# Patient Record
Sex: Male | Born: 1980 | Race: Black or African American | Hispanic: No | Marital: Single | Smoking: Current every day smoker
Health system: Southern US, Community
[De-identification: ages and names within clinical notes are randomized; demographics above are authoritative.]

---

## 2005-03-13 ENCOUNTER — Ambulatory Visit: Payer: Self-pay | Admitting: Family Medicine

## 2005-05-28 ENCOUNTER — Ambulatory Visit: Payer: Self-pay | Admitting: Family Medicine

## 2005-09-10 ENCOUNTER — Ambulatory Visit: Payer: Self-pay | Admitting: Family Medicine

## 2005-09-14 ENCOUNTER — Ambulatory Visit: Payer: Self-pay | Admitting: *Deleted

## 2007-07-16 ENCOUNTER — Ambulatory Visit: Payer: Self-pay | Admitting: Vascular Surgery

## 2007-07-16 ENCOUNTER — Inpatient Hospital Stay (HOSPITAL_COMMUNITY): Admission: EM | Admit: 2007-07-16 | Discharge: 2007-07-18 | Payer: Self-pay | Admitting: Emergency Medicine

## 2007-08-12 ENCOUNTER — Ambulatory Visit: Payer: Self-pay | Admitting: Vascular Surgery

## 2009-10-16 ENCOUNTER — Emergency Department (HOSPITAL_COMMUNITY): Admission: EM | Admit: 2009-10-16 | Discharge: 2009-10-16 | Payer: Self-pay | Admitting: Emergency Medicine
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
## 2010-11-26 ENCOUNTER — Emergency Department (HOSPITAL_COMMUNITY): Payer: No Typology Code available for payment source

## 2010-11-26 ENCOUNTER — Emergency Department (HOSPITAL_COMMUNITY)
Admission: EM | Admit: 2010-11-26 | Discharge: 2010-11-26 | Disposition: A | Discharge status: Home / Self Care | Payer: No Typology Code available for payment source | Attending: Emergency Medicine | Admitting: Emergency Medicine

## 2010-11-26 DIAGNOSIS — IMO0002 Reserved for concepts with insufficient information to code with codable children: Secondary | ICD-10-CM | POA: Insufficient documentation

## 2010-11-26 DIAGNOSIS — K029 Dental caries, unspecified: Secondary | ICD-10-CM | POA: Insufficient documentation

## 2010-11-26 DIAGNOSIS — Y92410 Unspecified street and highway as the place of occurrence of the external cause: Secondary | ICD-10-CM | POA: Insufficient documentation

## 2011-02-02 LAB — I-STAT 8, (EC8 V) (CONVERTED LAB)
Chloride: 110
Glucose, Bld: 141 — ABNORMAL HIGH
Potassium: 2.9 — ABNORMAL LOW
pH, Ven: 7.285

## 2011-02-02 LAB — CBC
HCT: 31.7 — ABNORMAL LOW
HCT: 33.7 — ABNORMAL LOW
HCT: 40.2
MCV: 88.7
MCV: 89.2
Platelets: 179
Platelets: 180
Platelets: 244
RDW: 13.3
RDW: 13.4
WBC: 13 — ABNORMAL HIGH

## 2011-02-02 LAB — BASIC METABOLIC PANEL
BUN: 3 — ABNORMAL LOW
Chloride: 106
Glucose, Bld: 130 — ABNORMAL HIGH
Potassium: 4.1

## 2011-02-02 LAB — POCT I-STAT 4, (NA,K, GLUC, HGB,HCT)
Operator id: 173791
Sodium: 143

## 2011-02-02 LAB — ETHANOL: Alcohol, Ethyl (B): 166 — ABNORMAL HIGH

## 2011-02-02 LAB — POCT I-STAT CREATININE
Creatinine, Ser: 1.3
Operator id: 133351

## 2011-03-02 ENCOUNTER — Emergency Department (HOSPITAL_COMMUNITY)
Admission: EM | Admit: 2011-03-02 | Discharge: 2011-03-02 | Discharge status: Against Medical Advice | Payer: Self-pay | Attending: Emergency Medicine | Admitting: Emergency Medicine

## 2011-03-02 DIAGNOSIS — Z0389 Encounter for observation for other suspected diseases and conditions ruled out: Secondary | ICD-10-CM | POA: Insufficient documentation

## 2013-08-10 ENCOUNTER — Emergency Department (HOSPITAL_COMMUNITY)
Admission: EM | Admit: 2013-08-10 | Discharge: 2013-08-10 | Disposition: A | Discharge status: Home / Self Care | Payer: Self-pay | Attending: Emergency Medicine | Admitting: Emergency Medicine

## 2013-08-10 ENCOUNTER — Emergency Department (HOSPITAL_COMMUNITY): Payer: Self-pay

## 2013-08-10 ENCOUNTER — Encounter (HOSPITAL_COMMUNITY): Payer: Self-pay | Admitting: Emergency Medicine

## 2013-08-10 DIAGNOSIS — M25561 Pain in right knee: Secondary | ICD-10-CM

## 2013-08-10 DIAGNOSIS — Z87891 Personal history of nicotine dependence: Secondary | ICD-10-CM | POA: Insufficient documentation

## 2013-08-10 DIAGNOSIS — S99929A Unspecified injury of unspecified foot, initial encounter: Principal | ICD-10-CM

## 2013-08-10 DIAGNOSIS — Y9389 Activity, other specified: Secondary | ICD-10-CM | POA: Insufficient documentation

## 2013-08-10 DIAGNOSIS — F17200 Nicotine dependence, unspecified, uncomplicated: Secondary | ICD-10-CM | POA: Insufficient documentation

## 2013-08-10 DIAGNOSIS — S8990XA Unspecified injury of unspecified lower leg, initial encounter: Secondary | ICD-10-CM | POA: Insufficient documentation

## 2013-08-10 DIAGNOSIS — S99919A Unspecified injury of unspecified ankle, initial encounter: Principal | ICD-10-CM

## 2013-08-10 DIAGNOSIS — M79661 Pain in right lower leg: Secondary | ICD-10-CM

## 2013-08-10 DIAGNOSIS — W108XXA Fall (on) (from) other stairs and steps, initial encounter: Secondary | ICD-10-CM | POA: Insufficient documentation

## 2013-08-10 DIAGNOSIS — Y929 Unspecified place or not applicable: Secondary | ICD-10-CM | POA: Insufficient documentation

## 2013-08-10 MED ORDER — OXYCODONE-ACETAMINOPHEN 5-325 MG PO TABS
2.0000 | ORAL_TABLET | Freq: Once | ORAL | Status: AC
  Administered 2013-08-10: 2 via ORAL
  Filled 2013-08-10: qty 2

## 2013-08-10 MED ORDER — HYDROCODONE-ACETAMINOPHEN 5-325 MG PO TABS: 1.0000 | ORAL_TABLET | ORAL | Status: AC | PRN

## 2013-08-10 NOTE — Discharge Instructions (Signed)
Read the information below.  Use the prescribed medication as directed.  Please discuss all new medications with your pharmacist.  Do not take additional tylenol while taking the prescribed pain medication to avoid overdose.  You may return to the Emergency Department at any time for worsening condition or any new symptoms that concern you.  If you develop fevers, uncontrolled pain, weakness or numbness of the extremity, severe discoloration of the skin, or you are unable to walk, return to the ER for a recheck.       Knee Bracing Knee braces are supports to help stabilize and protect an injured or painful knee. They come in many different styles. They should support and protect the knee without increasing the chance of other injuries to yourself or others. It is important not to have a false sense of security when using a brace. Knee braces that help you to keep using your knee:  Do not restore normal knee stability under high stress forces.  May decrease some aspects of athletic performance. Some of the different types of knee braces are:  Prophylactic knee braces are designed to prevent or reduce the severity of knee injuries during sports that make injury to the knee more likely.  Rehabilitative knee braces are designed to allow protected motion of:  Injured knees.  Knees that have been treated with or without surgery. There is no evidence that the use of a supportive knee brace protects the graft following a successful anterior cruciate ligament (ACL) reconstruction. However, braces are sometimes used to:   Protect injured ligaments.  Control knee movement during the initial healing period. They may be used as part of the treatment program for the various injured ligaments or cartilage of the knee including the:  Anterior cruciate ligament.  Medial collateral ligament.  Medial or lateral cartilage (meniscus).  Posterior cruciate ligament.  Lateral collateral  ligament. Rehabilitative knee braces are most commonly used:  During crutch-assisted walking right after injury.  During crutch-assisted walking right after surgery to repair the cartilage and/or cruciate ligament injury.  For a short period of time, 2-8 weeks, after the injury or surgery. The value of a rehabilitative brace as opposed to a cast or splint includes the:  Ability to adjust the brace for swelling.  Ability to remove the brace for examinations, icing or showering.  Ability to allow for movement in a controlled range of motion. Functional knee braces give support to knees that have already been injured. They are designed to provide stability for the injured knee and provide protection after repair. Functional knee braces may not affect performance much. Lower extremity muscle strengthening, flexibility, and improvement in technique are more important than bracing in treating ligamentous knee injuries. Functional braces are not a substitute for rehabilitation or surgical procedures. Unloader/offloader braces are designed to provide pain relief in arthritic knees. Patients with wear and tear arthritis from growing old or from an old cartilage injury (osteoarthritis) of the knee, and bow legged (varus) or knock knee (valgus) deformities, often develop increased pain in the arthritic side due to increased loading. Unloader/offloader braces are made to reduce uneven loading in such knees. There is reduction in bowing out movement in bow legged knees when the correct unloader brace is used. Patients with advanced osteoarthritis or severe varus or valgus alignment problems would not likely benefit from bracing. Patellofemoral braces help the kneecap to move smoothly and well centered over the end of the femur in the knee.  Most people who wear knee  braces feel that they help. However, there is a lack of scientific evidence that knee braces are helpful at the level needed for athletic  participation to prevent injury. In spite of this, athletes report an increase in knee stability, pain relief, performance improvement, and confidence during athletics when using a brace.  Different knee problems require different knee braces:  Your caregiver may suggest one kind of knee brace after knee surgery.  A caregiver may choose another kind of knee brace for support instead of surgery for some types of torn ligaments.  You may also need one for pain in the front of your knee that is not getting better with strengthening and flexibility exercises. Get your caregiver's advice if you want to try a knee brace. The caregiver will advise you on where to get them and provide a prescription when it is needed to fashion and/or fit the brace. Knee braces are the least important part of preventing knee injuries or getting better following injury. Stretching, strengthening and technique improvement are far more important in caring for and preventing knee injuries. When strengthening your knee, increase your activities a little at a time so as not to develop injuries from over use. Work out an exercise plan with your caregiver and/or physical therapist to get the best program for you. Do not let a knee brace become a crutch. Always remember, there are no braces which support the knee as well as your original ligaments and cartilage you were born with. Conditioning, proper warm-up and stretching remain the most important parts of keeping your knees healthy. HOW TO USE A KNEE BRACE  During sports, knee braces should be used as directed by your caregiver.  Make sure that the hinges are where the knee bends.  Straps, tapes, or hook-and-loop tapes should be fastened around your leg as instructed.  You should check the placement of the brace during activities to make sure that it has not moved. Poorly positioned braces can hurt rather than help you.  To work well, a knee brace should be worn during all  activities that put you at risk of knee injury.  Warm up properly before beginning athletic activities. HOME CARE INSTRUCTIONS  Knee braces often get damaged during normal use. Replace worn-out braces for maximum benefit.  Clean regularly with soap and water.  Inspect your brace often for wear and tear.  Cover exposed metal to protect others from injury.  Durable materials may cost more, but last longer. SEEK IMMEDIATE MEDICAL CARE IF:   Your knee seems to be getting worse rather than better.  You have increasing pain or swelling in the knee.  You have problems caused by the knee brace.  You have increased swelling or inflammation (redness or soreness) in your knee.  Your knee becomes warm and more painful and you develop an unexplained temperature over 101 F (38.3 C). MAKE SURE YOU:   Understand these instructions.  Will watch your condition.  Will get help right away if you are not doing well or get worse. See your caregiver, physical therapist or orthopedic surgeon for additional information. Document Released: 07/18/2003 Document Revised: 07/20/2011 Document Reviewed: 10/24/2008 [facility] Patient Information 2014 [place], [place].  Knee Pain Knee pain can be a result of an injury or other medical conditions. Treatment will depend on the cause of your pain. HOME CARE  Only take medicine as told by your doctor.  Keep a healthy weight. Being overweight can make the knee hurt more.  Stretch before exercising  or playing sports.  If there is constant knee pain, change the way you exercise. Ask your doctor for advice.  Make sure shoes fit well. Choose the right shoe for the sport or activity.  Protect your knees. Wear kneepads if needed.  Rest when you are tired. GET HELP RIGHT AWAY IF:   Your knee pain does not stop.  Your knee pain does not get better.  Your knee joint feels hot to the touch.  You have a fever. MAKE SURE YOU:   Understand these  instructions.  Will watch this condition.  Will get help right away if you are not doing well or get worse. Document Released: 07/24/2008 Document Revised: 07/20/2011 Document Reviewed: 07/24/2008 [facility] Patient Information 2014 [place], [place].  Musculoskeletal Pain Musculoskeletal pain is muscle and boney aches and pains. These pains can occur in any part of the body. Your caregiver may treat you without knowing the cause of the pain. They may treat you if blood or urine tests, X-rays, and other tests were normal.  CAUSES There is often not a definite cause or reason for these pains. These pains may be caused by a type of germ (virus). The discomfort may also come from overuse. Overuse includes working out too hard when your body is not fit. Boney aches also come from weather changes. Bone is sensitive to atmospheric pressure changes. HOME CARE INSTRUCTIONS   Ask when your test results will be ready. Make sure you get your test results.  Only take over-the-counter or prescription medicines for pain, discomfort, or fever as directed by your caregiver. If you were given medications for your condition, do not drive, operate machinery or power tools, or sign legal documents for 24 hours. Do not drink alcohol. Do not take sleeping pills or other medications that may interfere with treatment.  Continue all activities unless the activities cause more pain. When the pain lessens, slowly resume normal activities. Gradually increase the intensity and duration of the activities or exercise.  During periods of severe pain, bed rest may be helpful. Lay or sit in any position that is comfortable.  Putting ice on the injured area.  Put ice in a bag.  Place a towel between your skin and the bag.  Leave the ice on for 15 to 20 minutes, 3 to 4 times a day.  Follow up with your caregiver for continued problems and no reason can be found for the pain. If the pain becomes worse or does not go away, it  may be necessary to repeat tests or do additional testing. Your caregiver may need to look further for a possible cause. SEEK IMMEDIATE MEDICAL CARE IF:  You have pain that is getting worse and is not relieved by medications.  You develop chest pain that is associated with shortness or breath, sweating, feeling sick to your stomach (nauseous), or throw up (vomit).  Your pain becomes localized to the abdomen.  You develop any new symptoms that seem different or that concern you. MAKE SURE YOU:   Understand these instructions.  Will watch your condition.  Will get help right away if you are not doing well or get worse. Document Released: 04/27/2005 Document Revised: 07/20/2011 Document Reviewed: 12/30/2012 [facility] Patient Information 2014 [place], [place].

## 2013-08-10 NOTE — ED Notes (Signed)
Patient transported to X-ray 

## 2013-08-10 NOTE — ED Notes (Signed)
Ortho called 

## 2013-08-10 NOTE — Progress Notes (Signed)
Orthopedic Tech Progress Note Patient Details:  Chris CrazeMichael Mitchell 28-Sep-1980 [id number] Applied. Crutches fit for height and comfort. Ortho Devices Type of Ortho Device: Crutches;Knee Immobilizer Ortho Device/Splint Location: Right LE Ortho Device/Splint Interventions: Application   Asia R Thompson 08/10/2013, 1:06 PM

## 2013-08-10 NOTE — ED Notes (Signed)
Pt c/o R knee pain since falling on the knee last night. He injured this knee in the past also.
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
## 2013-08-10 NOTE — ED Notes (Signed)
Pt states his friend is in the waiting room with his food.  This RN advised pt not to eat anything until PA has seen him.

## 2013-08-11 NOTE — ED Provider Notes (Signed)
Medical screening examination/treatment/procedure(s) were conducted as a shared visit with non-physician practitioner(s) and myself.  I personally evaluated the patient during the encounter.   EKG Interpretation None     See other note.  Raeford RazorStephen Talishia Betzler, MD 08/11/13 [phone]

## 2013-08-11 NOTE — ED Provider Notes (Signed)
Medical screening examination/treatment/procedure(s) were conducted as a shared visit with non-physician practitioner(s) and myself.  I personally evaluated the patient during the encounter.   EKG Interpretation None     32yM with R knee/calf pain after fall while running up steps. Pt reports onset of pain when fell as opposed to him falling because of the pain. Doesn't remember specifically hitting it though. On exam, knee is normal to inspection but diffusely tender over distal femur, patella, medially, laterally and proximal fibula. No effusion. No laxity appreciated. Can range although with much increased pain. Calf is swollen and tender, worse laterally. Increased calf pain with both plantar and dorsiflexion. Partial tear of gastroc? Rest of exam doesn't necessarily fit though. Imaging neg. NVI. Symptomatic tx. Crutches. Ortho FU.   Raeford RazorStephen Kwali Wrinkle, MD 08/11/13 0930

## 2016-12-11 ENCOUNTER — Encounter (HOSPITAL_COMMUNITY): Payer: Self-pay | Admitting: *Deleted

## 2016-12-11 ENCOUNTER — Emergency Department (HOSPITAL_COMMUNITY)
Admission: EM | Admit: 2016-12-11 | Discharge: 2016-12-11 | Disposition: A | Discharge status: Home / Self Care | Payer: No Typology Code available for payment source | Attending: Emergency Medicine | Admitting: Emergency Medicine

## 2016-12-11 ENCOUNTER — Emergency Department (HOSPITAL_COMMUNITY): Payer: No Typology Code available for payment source

## 2016-12-11 DIAGNOSIS — S8981XA Other specified injuries of right lower leg, initial encounter: Secondary | ICD-10-CM | POA: Diagnosis present

## 2016-12-11 DIAGNOSIS — F17210 Nicotine dependence, cigarettes, uncomplicated: Secondary | ICD-10-CM | POA: Insufficient documentation

## 2016-12-11 DIAGNOSIS — M549 Dorsalgia, unspecified: Secondary | ICD-10-CM | POA: Insufficient documentation

## 2016-12-11 DIAGNOSIS — Y92410 Unspecified street and highway as the place of occurrence of the external cause: Secondary | ICD-10-CM | POA: Diagnosis not present

## 2016-12-11 DIAGNOSIS — M25561 Pain in right knee: Secondary | ICD-10-CM

## 2016-12-11 DIAGNOSIS — Y939 Activity, unspecified: Secondary | ICD-10-CM | POA: Diagnosis not present

## 2016-12-11 DIAGNOSIS — T148XXA Other injury of unspecified body region, initial encounter: Secondary | ICD-10-CM

## 2016-12-11 DIAGNOSIS — S86911A Strain of unspecified muscle(s) and tendon(s) at lower leg level, right leg, initial encounter: Secondary | ICD-10-CM | POA: Insufficient documentation

## 2016-12-11 DIAGNOSIS — Y999 Unspecified external cause status: Secondary | ICD-10-CM | POA: Diagnosis not present

## 2016-12-11 MED ORDER — CYCLOBENZAPRINE HCL 10 MG PO TABS: 10.0000 mg | ORAL_TABLET | Freq: Two times a day (BID) | ORAL | 0 refills | Status: AC | PRN
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
## 2016-12-11 NOTE — ED Notes (Signed)
Patient transported to X-ray 
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
## 2019-06-23 ENCOUNTER — Other Ambulatory Visit: Payer: Self-pay

## 2019-06-23 ENCOUNTER — Emergency Department (HOSPITAL_COMMUNITY)
Admission: EM | Admit: 2019-06-23 | Discharge: 2019-06-23 | Disposition: A | Discharge status: Home / Self Care | Payer: Self-pay | Attending: Emergency Medicine | Admitting: Emergency Medicine

## 2019-06-23 ENCOUNTER — Encounter (HOSPITAL_COMMUNITY): Payer: Self-pay | Admitting: Emergency Medicine

## 2019-06-23 DIAGNOSIS — W260XXA Contact with knife, initial encounter: Secondary | ICD-10-CM | POA: Insufficient documentation

## 2019-06-23 DIAGNOSIS — S81011A Laceration without foreign body, right knee, initial encounter: Secondary | ICD-10-CM | POA: Insufficient documentation

## 2019-06-23 DIAGNOSIS — Y939 Activity, unspecified: Secondary | ICD-10-CM | POA: Insufficient documentation

## 2019-06-23 DIAGNOSIS — Z5321 Procedure and treatment not carried out due to patient leaving prior to being seen by health care provider: Secondary | ICD-10-CM | POA: Insufficient documentation

## 2019-06-23 DIAGNOSIS — Y929 Unspecified place or not applicable: Secondary | ICD-10-CM | POA: Insufficient documentation

## 2019-06-23 DIAGNOSIS — Y999 Unspecified external cause status: Secondary | ICD-10-CM | POA: Insufficient documentation
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
## 2020-02-15 ENCOUNTER — Emergency Department (HOSPITAL_COMMUNITY)
Admission: EM | Admit: 2020-02-15 | Discharge: 2020-02-15 | Disposition: A | Discharge status: Home / Self Care | Payer: Self-pay | Attending: Emergency Medicine | Admitting: Emergency Medicine

## 2020-02-15 ENCOUNTER — Encounter (HOSPITAL_COMMUNITY): Payer: Self-pay | Admitting: Emergency Medicine

## 2020-02-15 ENCOUNTER — Other Ambulatory Visit: Payer: Self-pay

## 2020-02-15 DIAGNOSIS — K056 Periodontal disease, unspecified: Secondary | ICD-10-CM | POA: Insufficient documentation

## 2020-02-15 DIAGNOSIS — K0889 Other specified disorders of teeth and supporting structures: Secondary | ICD-10-CM | POA: Insufficient documentation

## 2020-02-15 DIAGNOSIS — F17210 Nicotine dependence, cigarettes, uncomplicated: Secondary | ICD-10-CM | POA: Insufficient documentation

## 2020-02-15 DIAGNOSIS — K047 Periapical abscess without sinus: Secondary | ICD-10-CM

## 2020-02-15 MED ORDER — BUPIVACAINE-EPINEPHRINE (PF) 0.5% -1:200000 IJ SOLN
1.8000 mL | Freq: Once | INTRAMUSCULAR | Status: AC
  Administered 2020-02-15: 20:00:00 1.8 mL
  Filled 2020-02-15: qty 1.8

## 2020-02-15 MED ORDER — AMOXICILLIN 500 MG PO CAPS
500.0000 mg | ORAL_CAPSULE | Freq: Once | ORAL | Status: AC
  Administered 2020-02-15: 20:00:00 500 mg via ORAL
  Filled 2020-02-15: qty 1

## 2020-02-15 MED ORDER — AMOXICILLIN 500 MG PO CAPS: 500.0000 mg | ORAL_CAPSULE | Freq: Three times a day (TID) | ORAL | 0 refills | Status: AC

## 2020-02-15 NOTE — ED Notes (Signed)
An After Visit Summary was printed and given to the patient. Discharge instructions given and no further questions at this time.  

## 2020-02-15 NOTE — ED Triage Notes (Addendum)
Pt c/o 10/10 dental pain on left upper side that began a couple months ago. Pt unable to work/sleep due to pain. Endorses "cold flashes" and dizziness that began today. Pt unable to eat but able to tolerate PO fluids. Current smoker. Denies shob and neck pain.

## 2020-02-15 NOTE — Discharge Instructions (Signed)
Your tooth is infected and the amoxicillin should help treat the infection which will help with the pain.  You can take 2 ibuprofen and 2 Tylenol together every 6 hours to help with the pain.  A list of dentists was printed off for you that around town that take patients that do not have insurance.

## 2020-02-15 NOTE — ED Provider Notes (Signed)
[facility]-EMERGENCY DEPT Provider Note   CSN: [id number] Arrival date & time: 02/15/20  1713     History Chief Complaint  Patient presents with  . Dental Pain    Chris Mitchell is a 39 y.o. male.  The history is provided by the patient.  Dental Pain Location:  Upper Upper teeth location:  13/LU 2nd bicuspid Quality:  Aching, pulsating, localized, radiating and shooting Severity:  Severe Onset quality:  Gradual Duration:  3 days Timing:  Constant Progression:  Worsening Chronicity:  Recurrent Context: dental caries   Relieved by:  Nothing Worsened by:  Cold food/drink, hot food/drink, jaw movement and touching Ineffective treatments:  None tried Associated symptoms: facial pain and gum swelling   Associated symptoms: no facial swelling, no fever, no neck swelling, no oral bleeding, no oral lesions and no trismus   Risk factors: lack of dental care, periodontal disease and smoking        History reviewed. No pertinent past medical history.  There are no problems to display for this patient.   History reviewed. No pertinent surgical history.     No family history on file.  Social History   Tobacco Use  . Smoking status: Current Every Day Smoker    Types: Cigarettes  . Smokeless tobacco: Never Used  Substance Use Topics  . Alcohol use: Yes  . Drug use: No    Home Medications Prior to Admission medications   Medication Sig Start Date End Date Taking? Authorizing Provider  cyclobenzaprine (FLEXERIL) 10 MG tablet Take 1 tablet (10 mg total) by mouth 2 (two) times daily as needed for muscle spasms. 12/11/16   Maxwell Caul, PA-C  HYDROcodone-acetaminophen (NORCO/VICODIN) 5-325 MG per tablet Take 1-2 tablets by mouth every 4 (four) hours as needed. 08/10/13   Trixie Dredge, PA-C    Allergies    Patient has no known allergies.  Review of Systems   Review of Systems  Constitutional: Negative for fever.  HENT: Negative for facial  swelling and mouth sores.   All other systems reviewed and are negative.   Physical Exam Updated Vital Signs BP (!) 165/75   Pulse 80   Temp 98.4 F (36.9 C) (Oral)   Resp 18   Ht 5\' 9"  (1.753 m)   Wt 68 kg   SpO2 100%   BMI 22.15 kg/m   Physical Exam Vitals and nursing note reviewed.  Constitutional:      General: He is not in acute distress.    Appearance: Normal appearance. He is normal weight.  HENT:     Head: Normocephalic.     Mouth/Throat:     Mouth: Mucous membranes are moist.      Comments: Diffuse periodontal disease Eyes:     Pupils: Pupils are equal, round, and reactive to light.  Cardiovascular:     Rate and Rhythm: Normal rate.     Pulses: Normal pulses.  Pulmonary:     Effort: Pulmonary effort is normal.  Musculoskeletal:     Cervical back: Normal range of motion and neck supple. No tenderness.  Skin:    General: Skin is warm.  Neurological:     Mental Status: He is alert and oriented to person, place, and time. Mental status is at baseline.  Psychiatric:        Mood and Affect: Mood normal.        Behavior: Behavior normal.        Thought Content: Thought content normal.  ED Results / Procedures / Treatments   Labs (all labs ordered are listed, but only abnormal results are displayed) Labs Reviewed - No data to display  EKG None  Radiology No results found.  Procedures Procedures (including critical care time)  Medications Ordered in ED Medications  bupivacaine-epinephrine (MARCAINE W/ EPI) 0.5% -1:200000 injection 1.8 mL (has no administration in time range)  amoxicillin (AMOXIL) capsule 500 mg (has no administration in time range)    ED Course  I have reviewed the triage vital signs and the nursing notes.  Pertinent labs & imaging results that were available during my care of the patient were reviewed by me and considered in my medical decision making (see chart for details).    MDM Rules/Calculators/A&P                           Pt with dental caries and facial swelling.  No signs of ludwig's angina or difficulty swallowing and no systemic symptoms. Will treat with amoxicillin and have pt f/u with dentist.  Final Clinical Impression(s) / ED Diagnoses Final diagnoses:  None    Rx / DC Orders ED Discharge Orders    None       Gwyneth Sprout, MD 02/15/20 1925

## 2020-02-16 NOTE — Progress Notes (Signed)
02/16/2020 224 pm TOC CM spoke to pt and he did get abx Rx filled for dental cavities. Message sent to ED provider for amb referral for dentist. Will provide pt with information to arrange appt. Isidoro Donning RN CCM, [facility] ED TOC CM [phone]

## 2020-12-04 ENCOUNTER — Other Ambulatory Visit: Payer: Self-pay

## 2020-12-04 ENCOUNTER — Other Ambulatory Visit: Payer: Self-pay | Admitting: Physician Assistant

## 2020-12-04 ENCOUNTER — Ambulatory Visit (INDEPENDENT_AMBULATORY_CARE_PROVIDER_SITE_OTHER): Payer: Self-pay

## 2020-12-04 DIAGNOSIS — Z021 Encounter for pre-employment examination: Secondary | ICD-10-CM

## 2020-12-12 ENCOUNTER — Encounter (HOSPITAL_COMMUNITY): Payer: Self-pay

## 2020-12-12 ENCOUNTER — Emergency Department (HOSPITAL_COMMUNITY)
Admission: EM | Admit: 2020-12-12 | Discharge: 2020-12-12 | Disposition: A | Discharge status: Home / Self Care | Payer: Self-pay | Attending: Emergency Medicine | Admitting: Emergency Medicine

## 2020-12-12 ENCOUNTER — Other Ambulatory Visit: Payer: Self-pay

## 2020-12-12 DIAGNOSIS — F17210 Nicotine dependence, cigarettes, uncomplicated: Secondary | ICD-10-CM | POA: Insufficient documentation

## 2020-12-12 DIAGNOSIS — H1031 Unspecified acute conjunctivitis, right eye: Secondary | ICD-10-CM

## 2020-12-12 DIAGNOSIS — G4700 Insomnia, unspecified: Secondary | ICD-10-CM | POA: Insufficient documentation

## 2020-12-12 MED ORDER — ERYTHROMYCIN 5 MG/GM OP OINT: TOPICAL_OINTMENT | OPHTHALMIC | 0 refills | Status: AC

## 2020-12-12 MED ORDER — TETRACAINE HCL 0.5 % OP SOLN
2.0000 [drp] | Freq: Once | OPHTHALMIC | Status: AC
  Administered 2020-12-12: 2 [drp] via OPHTHALMIC
  Filled 2020-12-12: qty 4

## 2020-12-12 MED ORDER — FLUORESCEIN SODIUM 1 MG OP STRP
1.0000 | ORAL_STRIP | Freq: Once | OPHTHALMIC | Status: AC
  Administered 2020-12-12: 1 via OPHTHALMIC
  Filled 2020-12-12: qty 1

## 2020-12-12 NOTE — ED Provider Notes (Signed)
[facility]-EMERGENCY DEPT Provider Note   CSN: [id number] Arrival date & time: 12/12/20  0139     History Chief Complaint  Patient presents with   Facial Swelling   Eye Drainage   Blurred Vision    Chris Mitchell is a 40 y.o. male patient presents with 3 days of right eye pain, swelling and discharge.  Denies injury or trauma.  Does not wear glasses or contacts.  Reports that it has been difficult to sleep because of the discomfort.  Reports that he has had green discharge from the right eye for the last several days and often wakes with it matted.  Denies headache, neck pain, neck stiffness, rhinorrhea, postnasal drip.  Patient reports the vision in his right eye is blurred but he has no diplopia.  Denies rash to his face or ear.  Denies otalgia.  No treatments prior to arrival.  No specific aggravating or alleviating factors.  The history is provided by the patient and medical records. No language interpreter was used.      History reviewed. No pertinent past medical history.  There are no problems to display for this patient.   History reviewed. No pertinent surgical history.     History reviewed. No pertinent family history.  Social History   Tobacco Use   Smoking status: Every Day    Types: Cigarettes   Smokeless tobacco: Never  Substance Use Topics   Alcohol use: Yes   Drug use: No    Home Medications Prior to Admission medications   Medication Sig Start Date End Date Taking? Authorizing Provider  erythromycin ophthalmic ointment Place a 1/2 inch ribbon of ointment into the lower eyelid 4x per day for 7 days 12/12/20  Yes Lexey Fletes, Dahlia Client, PA-C  amoxicillin (AMOXIL) 500 MG capsule Take 1 capsule (500 mg total) by mouth 3 (three) times daily. 02/15/20   Gwyneth Sprout, MD  cyclobenzaprine (FLEXERIL) 10 MG tablet Take 1 tablet (10 mg total) by mouth 2 (two) times daily as needed for muscle spasms. 12/11/16   Maxwell Caul, PA-C   HYDROcodone-acetaminophen (NORCO/VICODIN) 5-325 MG per tablet Take 1-2 tablets by mouth every 4 (four) hours as needed. 08/10/13   Trixie Dredge, PA-C    Allergies    Patient has no known allergies.  Review of Systems   Review of Systems  Constitutional:  Negative for appetite change, diaphoresis, fatigue, fever and unexpected weight change.  HENT:  Negative for mouth sores.   Eyes:  Positive for pain, discharge and redness. Negative for visual disturbance.  Respiratory:  Negative for cough, chest tightness, shortness of breath and wheezing.   Cardiovascular:  Negative for chest pain.  Gastrointestinal:  Negative for abdominal pain, constipation, diarrhea, nausea and vomiting.  Endocrine: Negative for polydipsia, polyphagia and polyuria.  Genitourinary:  Negative for dysuria, frequency, hematuria and urgency.  Musculoskeletal:  Negative for back pain and neck stiffness.  Skin:  Negative for rash.  Allergic/Immunologic: Negative for immunocompromised state.  Neurological:  Negative for syncope, light-headedness and headaches.  Hematological:  Does not bruise/bleed easily.  Psychiatric/Behavioral:  Negative for sleep disturbance. The patient is not nervous/anxious.    Physical Exam Updated Vital Signs BP 122/78 (BP Location: Left Arm)   Pulse 64   Temp 98.5 F (36.9 C) (Oral)   Resp 16   Ht 5\' 9"  (1.753 m)   Wt 68 kg   SpO2 97%   BMI 22.15 kg/m   Physical Exam Vitals and nursing note reviewed.  Constitutional:  General: He is not in acute distress.    Appearance: He is well-developed. He is not ill-appearing.  HENT:     Head: Normocephalic.  Eyes:     General: No scleral icterus.       Right eye: Discharge present.        Left eye: No discharge.     Extraocular Movements: Extraocular movements intact.     Conjunctiva/sclera:     Right eye: Right conjunctiva is injected. Chemosis and exudate present. No hemorrhage.    Left eye: Left conjunctiva is not injected. No  chemosis, exudate or hemorrhage.    Pupils: Pupils are equal, round, and reactive to light.     Slit lamp exam:    Right eye: No corneal flare, corneal ulcer or foreign body.     Left eye: No corneal flare, corneal ulcer or foreign body.  Cardiovascular:     Rate and Rhythm: Normal rate.  Pulmonary:     Effort: Pulmonary effort is normal.  Abdominal:     General: There is no distension.  Musculoskeletal:        General: Normal range of motion.     Cervical back: Normal range of motion.  Skin:    General: Skin is warm and dry.  Neurological:     Mental Status: He is alert.  Psychiatric:        Mood and Affect: Mood normal.    ED Results / Procedures / Treatments     Procedures Procedures   Medications Ordered in ED Medications  tetracaine (PONTOCAINE) 0.5 % ophthalmic solution 2 drop (has no administration in time range)  fluorescein ophthalmic strip 1 strip (has no administration in time range)    ED Course  I have reviewed the triage vital signs and the nursing notes.  Pertinent labs & imaging results that were available during my care of the patient were reviewed by me and considered in my medical decision making (see chart for details).    MDM Rules/Calculators/A&P                           Patient presents with bacterial conjunctivitis.  Denies glasses or contact usage.  Denies body fluids in his eyes or known injury.  Slit-lamp exam without evidence of corneal ulcer or corneal abrasion.  No dendritic lesions.  No rash to the face, nose or ears.  No facial swelling.  Exam consistent with bacterial conjunctivitis.  Will treat with erythromycin and patient will have close follow-up with ophthalmology.  Patient states understanding and is in agreement with the plan.   Final Clinical Impression(s) / ED Diagnoses Final diagnoses:  Acute bacterial conjunctivitis of right eye    Rx / DC Orders ED Discharge Orders          Ordered    erythromycin ophthalmic  ointment        12/12/20 0328             Corvin Sorbo, Dahlia Client, PA-C 12/12/20 0356    Horton, Mayer Masker, MD 12/12/20 [phone]

## 2020-12-12 NOTE — ED Triage Notes (Signed)
Pt complains of bilateral eye pain, swelling, drainage, and blurred vision x 3 days.

## 2020-12-12 NOTE — Discharge Instructions (Addendum)
1. Medications: erythromycin, usual home medications 2. Treatment: rest, drink plenty of fluids,  3. Follow Up: Please followup with ophthalmology 1-2 days for discussion of your diagnoses and further evaluation after today's visit; if you do not have a primary care doctor use the resource guide provided to find one; Please return to the ER for new or worsening symptoms

## 2021-09-10 ENCOUNTER — Emergency Department (HOSPITAL_COMMUNITY)
Admission: EM | Admit: 2021-09-10 | Discharge: 2021-09-11 | Disposition: A | Discharge status: Home / Self Care | Payer: BC Managed Care – PPO | Attending: Emergency Medicine | Admitting: Emergency Medicine

## 2021-09-10 ENCOUNTER — Encounter (HOSPITAL_COMMUNITY): Payer: Self-pay | Admitting: Emergency Medicine

## 2021-09-10 ENCOUNTER — Emergency Department (HOSPITAL_COMMUNITY): Payer: BC Managed Care – PPO

## 2021-09-10 ENCOUNTER — Other Ambulatory Visit: Payer: Self-pay

## 2021-09-10 DIAGNOSIS — M25461 Effusion, right knee: Secondary | ICD-10-CM | POA: Diagnosis not present

## 2021-09-10 DIAGNOSIS — M25561 Pain in right knee: Secondary | ICD-10-CM | POA: Insufficient documentation

## 2021-09-10 NOTE — ED Provider Notes (Signed)
?Chris Mitchell ?Provider Note ? ? ?CSN: [id number] ?Arrival date & time: 09/10/21  2251 ? ?  ? ?History ? ?Chief Complaint  ?Patient presents with  ? Leg Injury  ? Knee Pain  ? ? ?Chris Mitchell is a 41 y.o. male who presents to the ED today with complaint of gradual onset, constant, achy, R knee pain that occurred 5 days ago.  Patient reports that he was playing basketball when he began having immediate pain.  He is unsure the mechanism of action that caused the pain and is unsure if he heard anything pop.  He does report similar symptoms in the past.  Per chart review he was seen in 2015 for same.  He states this feels similar to then.  Does not appear he had any findings on x-ray however was provided crutches and advised to follow-up with Ortho.  He did not not do so.  He has not taken anything specifically for pain. ?  ? ?The history is provided by the patient and medical records.  ? ?  ? ?Home Medications ?Prior to Admission medications   ?Medication Sig Start Date End Date Taking? Authorizing Provider  ?amoxicillin (AMOXIL) 500 MG capsule Take 1 capsule (500 mg total) by mouth 3 (three) times daily. 02/15/20   Gwyneth Sprout, MD  ?cyclobenzaprine (FLEXERIL) 10 MG tablet Take 1 tablet (10 mg total) by mouth 2 (two) times daily as needed for muscle spasms. 12/11/16   Maxwell Caul, PA-C  ?erythromycin ophthalmic ointment Place a 1/2 inch ribbon of ointment into the lower eyelid 4x per day for 7 days 12/12/20   Muthersbaugh, Dahlia Client, PA-C  ?HYDROcodone-acetaminophen (NORCO/VICODIN) 5-325 MG per tablet Take 1-2 tablets by mouth every 4 (four) hours as needed. 08/10/13   Trixie Dredge, PA-C  ?   ? ?Allergies    ?Patient has no known allergies.   ? ?Review of Systems   ?Review of Systems  ?Constitutional:  Negative for chills and fever.  ?Musculoskeletal:  Positive for arthralgias.  ?Skin:  Negative for wound.  ?All other systems reviewed and are negative. ? ?Physical Exam ?Updated Vital  Signs ?BP (!) 151/107   Pulse 94   Temp 98.2 ?F (36.8 ?C) (Oral)   Resp 16   SpO2 100%  ?Physical Exam ?Vitals and nursing note reviewed.  ?Constitutional:   ?   Appearance: He is not ill-appearing.  ?HENT:  ?   Head: Normocephalic and atraumatic.  ?Eyes:  ?   Conjunctiva/sclera: Conjunctivae normal.  ?Cardiovascular:  ?   Rate and Rhythm: Normal rate and regular rhythm.  ?Pulmonary:  ?   Effort: Pulmonary effort is normal.  ?   Breath sounds: Normal breath sounds.  ?Musculoskeletal:  ?   Comments: Mild swelling noted to R knee. No erythema or increased warmth. + diffuse TTP. ROM limited s/2 pain. Strength and sensation intact throughout. 2+ PT pulse.   ?Skin: ?   General: Skin is warm and dry.  ?   Coloration: Skin is not jaundiced.  ?Neurological:  ?   Mental Status: He is alert.  ? ? ?ED Results / Procedures / Treatments   ?Labs ?(all labs ordered are listed, but only abnormal results are displayed) ?Labs Reviewed - No data to display ? ?EKG ?None ? ?Radiology ?DG Knee Right Port ? ?Result Date: 09/10/2021 ?CLINICAL DATA:  Knee pain. EXAM: PORTABLE RIGHT KNEE - 1-2 VIEW COMPARISON:  None Available. FINDINGS: Joint effusion is present. There is no evidence for fracture or dislocation.  Joint spaces are maintained. IMPRESSION: 1. Joint effusion. 2. No acute fracture or dislocation. Electronically Signed   By: Darliss Cheney M.D.   On: 09/10/2021 23:31   ? ?Procedures ?Procedures  ? ? ?Medications Ordered in ED ?Medications - No data to display ? ?ED Course/ Medical Decision Making/ A&P ?  ?                        ?Medical Decision Making ?41 year old male who presents to the ED today with complaint of right knee pain for the past 5 days after playing basketball.  History of previous injury in 2015.  On arrival to the ED today vitals are stable.  Patient was medically screened by myself and work-up started including an x-ray of the right knee.  On exam he is noted to have diffuse tenderness palpation with swelling.   He has no overlying skin changes including erythema or increased warmth to suggest gout or septic arthritis.  He is neurovascular intact throughout.  He does have a mild antalgic gait.  We will follow-up on x-ray however patient will likely need crutches and knee immobilizer with Ortho follow-up. ? ?Xray positive for joint effusion. No other bony abnormalities. Pt discharged with knee immobilizer and crutches. RICE therapy discussed as well as ortho follow up. Pt in agreement with plan and stable for discharge home.  ? ?Problems Addressed: ?Acute pain of right knee: acute illness or injury ?Effusion of right knee: acute illness or injury ? ?Amount and/or Complexity of Data Reviewed ?Radiology: ordered. ? ? ? ? ? ? ? ? ? ?Final Clinical Impression(s) / ED Diagnoses ?Final diagnoses:  ?Acute pain of right knee  ?Effusion of right knee  ? ? ?Rx / DC Orders ?ED Discharge Orders   ? ? None  ? ?  ? ? ? ?Discharge Instructions   ? ?  ?Please follow up with orthopedist Dr. Linna Caprice for further evaluation of your knee pain.  ? ?Use the knee immobilizer and crutches until you can be seen by him or his team.  ? ?While at home please rest, ice, and elevate your knee to help reduce pain/inflammation. Take Ibuprofen and Tylenol as needed for pain.  ? ?Return to the ED for any new/worsening symptoms ? ? ? ? ?  ?Tanda Rockers, PA-C ?09/10/21 2350 ? ?  ?Tegeler, Canary Brim, MD ?09/11/21 1124 ? ?

## 2021-09-10 NOTE — ED Provider Triage Note (Signed)
Emergency Medicine Provider Triage Evaluation Note ? ?Chris Mitchell , a 41 y.o. male  was evaluated in triage.  Pt complains of gradual onset, constant, achy, R knee pain that occurred 5 days ago.  Patient reports that he was playing basketball when he began having immediate pain.  He is unsure the mechanism of action that caused the pain and is unsure if he heard anything pop.  He does report similar symptoms in the past.  Per chart review he was seen in 2015 for same.  He states this feels similar to then.  Does not appear he had any findings on x-ray however was provided crutches and advised to follow-up with Ortho.  He did not not do so.  He has not taken anything specifically for pain. ? ?Review of Systems  ?Positive: + R knee pain ?Negative:  ? ?Physical Exam  ?BP (!) 151/107   Pulse 94   Temp 98.2 ?F (36.8 ?C) (Oral)   Resp 16   SpO2 100%  ?Gen:   Awake, no distress   ?Resp:  Normal effort  ?MSK:   Moves extremities without difficulty  ?Other:   ? ?Medical Decision Making  ?Medically screening exam initiated at 11:11 PM.  Appropriate orders placed.  Cayle Bailey was informed that the remainder of the evaluation will be completed by another provider, this initial triage assessment does not replace that evaluation, and the importance of remaining in the ED until their evaluation is complete. ? ? ?  ?Eustaquio Maize, PA-C ?09/10/21 2312 ? ?

## 2021-09-10 NOTE — ED Notes (Addendum)
I provided reinforced discharge education based off of discharge instructions. Pt acknowledged and understood my education.  ?

## 2021-09-10 NOTE — ED Triage Notes (Signed)
Patient was playing basketball last Friday and believes he injured his left knee. He reports swelling to the area.  ?

## 2021-09-10 NOTE — ED Notes (Signed)
Pt ambulatory without assistance.  

## 2021-09-10 NOTE — Discharge Instructions (Addendum)
Please follow up with orthopedist Dr. Linna Caprice for further evaluation of your knee pain.  ? ?Use the knee immobilizer and crutches until you can be seen by him or his team.  ? ?While at home please rest, ice, and elevate your knee to help reduce pain/inflammation. Take Ibuprofen and Tylenol as needed for pain.  ? ?Return to the ED for any new/worsening symptoms ?

## 2021-09-11 MED ORDER — ACETAMINOPHEN 500 MG PO TABS
1000.0000 mg | ORAL_TABLET | Freq: Once | ORAL | Status: AC
  Administered 2021-09-11: 1000 mg via ORAL
  Filled 2021-09-11: qty 2

## 2021-09-11 MED ORDER — ACETAMINOPHEN 325 MG PO TABS
650.0000 mg | ORAL_TABLET | Freq: Once | ORAL | Status: DC
  Filled 2021-09-11: qty 2

## 2021-09-11 NOTE — ED Notes (Addendum)
I discussed with pt in regards to make a follow up appointment as directed on d/c paperwork. Pt inquired about his light duty instructions, I discussed with Amjad PA who informed me to let pt know to follow up eval with Orthopedics on d/c paperwork to have them follow up and make a plan for his light duty instructions. Pt requested Tylenol in which Dykstra MD provided verbal order for. Pt had no further questions or concerns. ?

## 2024-02-19 ENCOUNTER — Other Ambulatory Visit: Payer: Self-pay

## 2024-02-19 ENCOUNTER — Emergency Department (HOSPITAL_COMMUNITY)
Admission: EM | Admit: 2024-02-19 | Discharge: 2024-02-19 | Disposition: A | Discharge status: Home / Self Care | Attending: Emergency Medicine | Admitting: Emergency Medicine

## 2024-02-19 ENCOUNTER — Emergency Department (HOSPITAL_COMMUNITY)

## 2024-02-19 ENCOUNTER — Encounter (HOSPITAL_COMMUNITY): Payer: Self-pay | Admitting: Emergency Medicine

## 2024-02-19 DIAGNOSIS — Z720 Tobacco use: Secondary | ICD-10-CM | POA: Insufficient documentation

## 2024-02-19 DIAGNOSIS — R569 Unspecified convulsions: Secondary | ICD-10-CM | POA: Insufficient documentation

## 2024-02-19 LAB — CBG MONITORING, ED: Glucose-Capillary: 120 mg/dL — ABNORMAL HIGH (ref 70–99)

## 2024-02-19 LAB — CBC
HCT: 38.1 % — ABNORMAL LOW (ref 39.0–52.0)
Hemoglobin: 12.3 g/dL — ABNORMAL LOW (ref 13.0–17.0)
MCH: 28.4 pg (ref 26.0–34.0)
MCHC: 32.3 g/dL (ref 30.0–36.0)
MCV: 88 fL (ref 80.0–100.0)
Platelets: 325 K/uL (ref 150–400)
RBC: 4.33 MIL/uL (ref 4.22–5.81)
RDW: 15 % (ref 11.5–15.5)
WBC: 7.7 K/uL (ref 4.0–10.5)
nRBC: 0 % (ref 0.0–0.2)

## 2024-02-19 LAB — COMPREHENSIVE METABOLIC PANEL WITH GFR
ALT: 21 U/L (ref 0–44)
AST: 25 U/L (ref 15–41)
Albumin: 3 g/dL — ABNORMAL LOW (ref 3.5–5.0)
Alkaline Phosphatase: 56 U/L (ref 38–126)
Anion gap: 9 (ref 5–15)
BUN: 10 mg/dL (ref 6–20)
CO2: 24 mmol/L (ref 22–32)
Calcium: 8.9 mg/dL (ref 8.9–10.3)
Chloride: 107 mmol/L (ref 98–111)
Creatinine, Ser: 0.92 mg/dL (ref 0.61–1.24)
GFR, Estimated: >60 mL/min (ref >60)
Glucose, Bld: 130 mg/dL — ABNORMAL HIGH (ref 70–99)
Potassium: 3.8 mmol/L (ref 3.5–5.1)
Sodium: 140 mmol/L (ref 135–145)
Total Bilirubin: 0.5 mg/dL (ref 0.0–1.2)
Total Protein: 5.6 g/dL — ABNORMAL LOW (ref 6.5–8.1)

## 2024-02-19 LAB — MAGNESIUM: Magnesium: 1.9 mg/dL (ref 1.7–2.4)

## 2024-02-19 LAB — ETHANOL: Alcohol, Ethyl (B): <15 mg/dL (ref <15)

## 2024-02-19 NOTE — ED Provider Notes (Signed)
 [place] EMERGENCY DEPARTMENT AT [facility] Provider Note   CSN: [id number] Arrival date & time: 02/19/24  9298     Patient presents with: Seizures   Chris Mitchell is a 43 y.o. male with past medical history of alcohol use, tobacco use presents to emergency room with complaint of seizure.  According to EMS patient was found on the ground of [employer] having seizure-like activity citing full body shaking that lasted approximately 2 minutes.  After EMS picked up the patient to bring to ER he apparently had repetitive questioning, confusion and appeared postictal.  He continues to have some confusion about the event and he does not remember it, he is now alert and oriented.  He has no history of seizures.  He does deny drug or alcohol use to me.  He denies any pain.  He reports he is hungry and he wants something to eat and wants to leave.     Seizures      Prior to Admission medications   Medication Sig Start Date End Date Taking? Authorizing Provider  amoxicillin  (AMOXIL ) 500 MG capsule Take 1 capsule (500 mg total) by mouth 3 (three) times daily. 02/15/20   Doretha Folks, MD  cyclobenzaprine  (FLEXERIL ) 10 MG tablet Take 1 tablet (10 mg total) by mouth 2 (two) times daily as needed for muscle spasms. 12/11/16   Layden, Lindsey A, PA-C  erythromycin  ophthalmic ointment Place a 1/2 inch ribbon of ointment into the lower eyelid 4x per day for 7 days 12/12/20   Muthersbaugh, Chiquita, PA-C  HYDROcodone -acetaminophen  (NORCO/VICODIN) 5-325 MG per tablet Take 1-2 tablets by mouth every 4 (four) hours as needed. 08/10/13   Devora Perkins, PA-C    Allergies: Patient has no known allergies.    Review of Systems  Neurological:  Positive for seizures.    Updated Vital Signs BP 124/85 (BP Location: Right Arm)   Pulse 87   Temp 97.9 F (36.6 C) (Oral)   Resp 16   Ht 5' 9 (1.753 m)   Wt 68 kg   SpO2 100%   BMI 22.15 kg/m   Physical Exam Vitals and nursing note reviewed.   Constitutional:      General: He is not in acute distress.    Appearance: He is not toxic-appearing.  HENT:     Head: Normocephalic and atraumatic.  Eyes:     General: No scleral icterus.    Conjunctiva/sclera: Conjunctivae normal.  Cardiovascular:     Rate and Rhythm: Normal rate and regular rhythm.     Pulses: Normal pulses.     Heart sounds: Normal heart sounds.  Pulmonary:     Effort: Pulmonary effort is normal. No respiratory distress.     Breath sounds: Normal breath sounds.  Abdominal:     General: Abdomen is flat. Bowel sounds are normal.     Palpations: Abdomen is soft.     Tenderness: There is no abdominal tenderness.  Musculoskeletal:     Right lower leg: No edema.     Left lower leg: No edema.  Skin:    General: Skin is warm and dry.     Findings: No lesion.  Neurological:     General: No focal deficit present.     Mental Status: He is alert and oriented to person, place, and time. Mental status is at baseline.     (all labs ordered are listed, but only abnormal results are displayed) Labs Reviewed  COMPREHENSIVE METABOLIC PANEL WITH GFR - Abnormal; Notable for  the following components:      Result Value   Glucose, Bld 130 (*)    Total Protein 5.6 (*)    Albumin 3.0 (*)    All other components within normal limits  CBC - Abnormal; Notable for the following components:   Hemoglobin 12.3 (*)    HCT 38.1 (*)    All other components within normal limits  CBG MONITORING, ED - Abnormal; Notable for the following components:   Glucose-Capillary 120 (*)    All other components within normal limits  MAGNESIUM  ETHANOL  RAPID URINE DRUG SCREEN, HOSP PERFORMED    EKG: EKG Interpretation Date/Time:  Saturday February 19 2024 08:03:45 EDT Ventricular Rate:  84 PR Interval:  136 QRS Duration:  82 QT Interval:  356 QTC Calculation: 420 R Axis:   86  Text Interpretation: Normal sinus rhythm ST elevation, consider early repolarization Nonspecific ST and T wave  abnormality Abnormal ECG No previous ECGs available no prior ECG for comparison No STEMI Confirmed by Ginger Barefoot ([id number]) on 02/19/2024 9:20:10 AM  Radiology: CT Head Wo Contrast Result Date: 02/19/2024 EXAM: CT HEAD WITHOUT CONTRAST 02/19/2024 08:37:24 AM TECHNIQUE: CT of the head was performed without the administration of intravenous contrast. Automated exposure control, iterative reconstruction, and/or weight based adjustment of the mA/kV was utilized to reduce the radiation dose to as low as reasonably achievable. COMPARISON: None available. CLINICAL HISTORY: 43 year old male with new-onset seizure, no history of trauma. FINDINGS: BRAIN AND VENTRICLES: No acute hemorrhage. No evidence of acute infarct. No hydrocephalus. No extra-axial collection. No mass effect or midline shift. Normal brain volume. No suspicious intracranial vascular hyperdensity. ORBITS: No acute abnormality. SINUSES: Advanced left maxillary dental disease. Associated chronic appearing sinusitis at the left maxillary alveolar recess, with mucoperiosteal thickening. Other paranasal sinuses, middle ears and mastoids well aerated. SOFT TISSUES AND SKULL: Broad based right posterior scalp convexity soft tissue swelling. No skull fracture. IMPRESSION: 1. Right scalp soft tissue injury without skull fracture. 2. Normal for age non-contrast CT appearance of the brain. 3. 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 Electronically signed by: Helayne Hurst MD 02/19/2024 08:50 AM EDT RP Workstation: [id number]     Procedures   Medications Ordered in the ED - No data to display                                  Medical Decision Making Amount and/or Complexity of Data Reviewed Labs: ordered. Radiology: ordered.   This patient presents to the ED for concern of seizure-like activity, this involves an extensive number of treatment options, and is a complaint that carries with it a high risk of complications  and morbidity.  The differential diagnosis includes alcohol withdrawal, drug use, electrolyte abnormality, dehydration, seizure   Co morbidities that complicate the patient evaluation  History of alcohol use    Lab Tests:  I personally interpreted labs.  The pertinent results include:   CBC with no leukocytosis.  Hemoglobin is 12.3 CMP is grossly unremarkable, magnesium is within normal limits EtOH normal.   Imaging Studies ordered:  I ordered imaging studies including CT head I independently visualized and interpreted imaging which showed no acute findings I agree with the radiologist interpretation   Cardiac Monitoring: / EKG:  The patient was maintained on a cardiac monitor.  I personally viewed and interpreted the cardiac monitored which showed an underlying rhythm of: Normal sinus, ST changes likely  repolarization.    Problem List / ED Course / Critical interventions / Medication management  Patient reports to emergency room after having reported seizure-like activity.  Patient does not remember this event.  He has no seizure activity.  He does appear atraumatic on arrival.  He is hemodynamically stable.  He is alert and oriented x 4.  He is moving extremities without difficulty.  Lungs are clear to auscultation.  He has no focal abdominal tenderness and he is able to ambulate with steady gait.  I did obtain lab work here which is grossly unremarkable.  His head CT shows no acute findings.  He has no QT prolongation on EKG. I have reviewed the patients home medicines and have made adjustments as needed. Patient has had an overall reassuring workup here.  He is back to baseline.  He ambulates with steady gait.  He is tolerating oral intake.  He was given very strict seizure precautions and he is understanding agrees he cannot drive or operate heavy machinery for 6 months nor should he do any activities that would put himself or others in danger if he were to have another  seizure.  He understands if he has a second seizure he needs to return to emergency room and we consider starting antiepileptic medication.  He understands he needs to follow-up with neurology.  Feel stable for discharge with close outpatient follow-up.  Given return precautions.        Final diagnoses:  Seizure [facility])    ED Discharge Orders          Ordered    Ambulatory referral to Neurology       Comments: An appointment is requested in approximately: 2 weeks   02/19/24 0925               Shermon Warren SAILOR, PA-C 02/19/24 1016    Tegeler, Lonni PARAS, MD 02/19/24 1511

## 2024-02-19 NOTE — ED Triage Notes (Signed)
 BIB [facility], was found in bathroom by staff reporting episode of seizure like activity with full body shaking lasting approx. 2 min. EMS reports repetitive questioning and postictal on arrival. Pt has no memory of the event and denied having seizure. Pt agitated with EMS. VSS. No reported history of seizures.   BGL 140.

## 2024-02-19 NOTE — Discharge Instructions (Addendum)
 Given the story the EMS shares today it is concerning that you had seizure.  For first-time seizure you do not need to be started on medicine however if you were to have another seizure you would be considered a candidate for antiepileptics.  Your workup including head CT was overall reassuring.  Follow-up with your primary care doctor. Do not drive, swim, or participate in other potentially dangerous activities is important for 6 months, need to be cleared by neurology. You need to follow up with them in outpatient setting.  Return to ER with new or worsening symptoms including repeat seizure.

## 2024-02-19 NOTE — ED Notes (Signed)
Pt unable to provide UDS at this time

## 2024-02-19 NOTE — Progress Notes (Signed)
 IV established in right AC. 12-lead ECG was established. PT requested food and was informed that he is not able to eat food at the time. PT was unable to provide a urine sample at the time.

## 2024-02-19 NOTE — ED Notes (Signed)
 The patient was provided discharge instructions and reviewed. The patient expressed understanding. The patient was provided bus pass, sandwich bag and drinks to-go.

## 2024-05-17 ENCOUNTER — Telehealth: Payer: Self-pay | Admitting: Diagnostic Neuroimaging

## 2024-05-17 NOTE — Telephone Encounter (Signed)
 LVM to inform patient we need to reschedule his 05/17/24

## 2024-05-18 ENCOUNTER — Encounter: Payer: Self-pay | Admitting: Neurology

## 2024-05-18 ENCOUNTER — Ambulatory Visit: Admitting: Diagnostic Neuroimaging
